# Patient Record
Sex: Male | Born: 1990 | Race: White | Hispanic: No | Marital: Single | Smoking: Current every day smoker
Health system: Southern US, Community
[De-identification: ages and names within clinical notes are randomized; demographics above are authoritative.]

## PROBLEM LIST (undated history)

## (undated) HISTORY — PX: HAND SURGERY: SHX662

---

## 2017-10-26 ENCOUNTER — Emergency Department: Payer: BLUE CROSS/BLUE SHIELD

## 2017-10-26 ENCOUNTER — Other Ambulatory Visit: Payer: Self-pay

## 2017-10-26 ENCOUNTER — Encounter: Payer: Self-pay | Admitting: Emergency Medicine

## 2017-10-26 ENCOUNTER — Emergency Department
Admission: EM | Admit: 2017-10-26 | Discharge: 2017-10-26 | Disposition: A | Discharge status: Home / Self Care | Payer: BLUE CROSS/BLUE SHIELD | Attending: Emergency Medicine | Admitting: Emergency Medicine

## 2017-10-26 DIAGNOSIS — R109 Unspecified abdominal pain: Secondary | ICD-10-CM

## 2017-10-26 DIAGNOSIS — K859 Acute pancreatitis without necrosis or infection, unspecified: Secondary | ICD-10-CM | POA: Diagnosis not present

## 2017-10-26 DIAGNOSIS — F17210 Nicotine dependence, cigarettes, uncomplicated: Secondary | ICD-10-CM | POA: Insufficient documentation

## 2017-10-26 LAB — CBC
HCT: 49.5 % (ref 40.0–52.0)
HEMOGLOBIN: 16.3 g/dL (ref 13.0–18.0)
MCH: 31.7 pg (ref 26.0–34.0)
MCHC: 32.9 g/dL (ref 32.0–36.0)
MCV: 96.4 fL (ref 80.0–100.0)
Platelets: 228 10*3/uL (ref 150–440)
RBC: 5.13 MIL/uL (ref 4.40–5.90)
RDW: 13.1 % (ref 11.5–14.5)
WBC: 11 10*3/uL — ABNORMAL HIGH (ref 3.8–10.6)

## 2017-10-26 LAB — COMPREHENSIVE METABOLIC PANEL
ALT: 26 U/L (ref 17–63)
ANION GAP: 10 (ref 5–15)
AST: 22 U/L (ref 15–41)
Albumin: 4.4 g/dL (ref 3.5–5.0)
Alkaline Phosphatase: 57 U/L (ref 38–126)
BUN: 12 mg/dL (ref 6–20)
CHLORIDE: 104 mmol/L (ref 101–111)
CO2: 25 mmol/L (ref 22–32)
Calcium: 9 mg/dL (ref 8.9–10.3)
Creatinine, Ser: 1.02 mg/dL (ref 0.61–1.24)
GFR calc Af Amer: >60 mL/min (ref >60)
GFR calc non Af Amer: >60 mL/min (ref >60)
Glucose, Bld: 85 mg/dL (ref 65–99)
Potassium: 3.8 mmol/L (ref 3.5–5.1)
SODIUM: 139 mmol/L (ref 135–145)
Total Bilirubin: 0.5 mg/dL (ref 0.3–1.2)
Total Protein: 7.2 g/dL (ref 6.5–8.1)

## 2017-10-26 LAB — URINALYSIS, COMPLETE (UACMP) WITH MICROSCOPIC
Bacteria, UA: NONE SEEN
Bilirubin Urine: NEGATIVE
Glucose, UA: NEGATIVE mg/dL
HGB URINE DIPSTICK: NEGATIVE
KETONES UR: NEGATIVE mg/dL
LEUKOCYTES UA: NEGATIVE
Nitrite: NEGATIVE
PROTEIN: NEGATIVE mg/dL
SQUAMOUS EPITHELIAL / LPF: NONE SEEN
Specific Gravity, Urine: 1.008 (ref 1.005–1.030)
pH: 6 (ref 5.0–8.0)

## 2017-10-26 LAB — LIPASE, BLOOD: LIPASE: 81 U/L — AB (ref 11–51)

## 2017-10-26 MED ORDER — NAPROXEN 375 MG PO TABS: 375.0000 mg | ORAL_TABLET | Freq: Two times a day (BID) | ORAL | 0 refills | Status: AC

## 2017-10-26 MED ORDER — ONDANSETRON HCL 4 MG PO TABS: 4.0000 mg | ORAL_TABLET | Freq: Three times a day (TID) | ORAL | 0 refills | Status: AC | PRN

## 2017-10-26 MED ORDER — KETOROLAC TROMETHAMINE 30 MG/ML IJ SOLN
30.0000 mg | Freq: Once | INTRAMUSCULAR | Status: AC
  Administered 2017-10-26: 30 mg via INTRAVENOUS
  Filled 2017-10-26: qty 1

## 2017-10-26 MED ORDER — IBUPROFEN 400 MG PO TABS: 600.0000 mg | ORAL_TABLET | Freq: Once | ORAL | Status: DC

## 2017-10-26 MED ORDER — IBUPROFEN 400 MG PO TABS
400.0000 mg | ORAL_TABLET | Freq: Once | ORAL | Status: AC
  Administered 2017-10-26: 400 mg via ORAL

## 2017-10-26 MED ORDER — IBUPROFEN 400 MG PO TABS
ORAL_TABLET | ORAL | Status: AC
  Filled 2017-10-26: qty 1

## 2017-10-26 NOTE — ED Notes (Signed)
Pt to the er for RUQ pain that runs diagonal to the bladder. No difficulty in urinating. Pt has a hx of kidney stones. Pt reports urinary frequency. Pt has a hx of kidney stones. Pt says pain feels the same. Pt still has gallbladder.

## 2017-10-26 NOTE — ED Triage Notes (Signed)
Pt c/o lower abdominal pain that started about 45 minutes PTA. Pt states that the pain started after he ate. Pt states that he has a hx/o kidney stones but this pain feels similar. Pt in NAD at this time.

## 2017-10-26 NOTE — Discharge Instructions (Addendum)
Avoid alcohol, return to the emergency room for any new or worrisome symptoms including increased pain, vomiting, fever, follow closely with primary care doctor.  It is certainly possible that you have passed a stone, but we do not see any ongoing kidney stones at this time on CT scan.  Sometimes, we cannot see very small stones so you  may expect to have some pain for the next few days.  In addition, your lipase, which is a pancreas marker, is very slightly elevated.  If you have severe pain in her upper stomach or persistent vomiting high fever or other concerns please return immediately to the emergency department

## 2017-10-26 NOTE — ED Provider Notes (Signed)
[facility] Emergency Department Provider Note  ____________________________________________   I have reviewed the triage vital signs and the nursing notes. Where available I have reviewed prior notes and, if possible and indicated, outside hospital notes.    HISTORY  Chief Complaint Abdominal Pain    HPI Kevin Nash is a 27 y.o. male a history of kidney stones, he does drink alcohol, presents today complaining of right-sided abdominal pain that radiated to his groin started sharp earlier today, no other symptoms.  Feels that he may have passed a kidney stone.  Denies fever chills nausea or vomiting a.  Patient states he does drink alcohol but not to excess.  He does have several beers a day.  He denies any  Other alleviating or aggravating symptoms, pain is mild to moderate at this time.  Feels exactly like prior kidney stone.  History reviewed. No pertinent past medical history.  There are no active problems to display for this patient.   Past Surgical History:  Procedure Laterality Date  . HAND SURGERY Bilateral     Prior to Admission medications   Not on File    Allergies Penicillins  No family history on file.  Social History Social History   Tobacco Use  . Smoking status: Current Every Day Smoker  . Smokeless tobacco: Current User    Types: Snuff  Substance Use Topics  . Alcohol use: Yes  . Drug use: No    Review of Systems Constitutional: No fever/chills Eyes: No visual changes. ENT: No sore throat. No stiff neck no neck pain Cardiovascular: Denies chest pain. Respiratory: Denies shortness of breath. Gastrointestinal:   no vomiting.  No diarrhea.  No constipation. Genitourinary: Negative for dysuria. Musculoskeletal: Negative lower extremity swelling Skin: Negative for rash. Neurological: Negative for severe headaches, focal weakness or numbness.   ____________________________________________   PHYSICAL  EXAM:  VITAL SIGNS: ED Triage Vitals  Enc Vitals Group     BP 10/26/17 1738 135/87     Pulse Rate 10/26/17 1738 81     Resp 10/26/17 1738 16     Temp 10/26/17 1738 97.9 F (36.6 C)     Temp Source 10/26/17 1738 Oral     SpO2 10/26/17 1738 96 %     Weight 10/26/17 1741 165 lb (74.8 kg)     Height 10/26/17 1741 5\' 7"  (1.702 m)     Head Circumference --      Peak Flow --      Pain Score 10/26/17 1738 4     Pain Loc --      Pain Edu? --      Excl. in GC? --     Constitutional: Alert and oriented. Well appearing and in no acute distress. Eyes: Conjunctivae are normal Head: Atraumatic HEENT: No congestion/rhinnorhea. Mucous membranes are moist.  Oropharynx non-erythematous Neck:   Nontender with no meningismus, no masses, no stridor Cardiovascular: Normal rate, regular rhythm. Grossly normal heart sounds.  Good peripheral circulation. Respiratory: Normal respiratory effort.  No retractions. Lungs CTAB. Abdominal: Soft and nontender. No distention. No guarding no rebound Back:  There is no focal tenderness or step off.  there is no midline tenderness there are no lesions noted. there is positive right CVA tenderness Musculoskeletal: No lower extremity tenderness, no upper extremity tenderness. No joint effusions, no DVT signs strong distal pulses no edema Neurologic:  Normal speech and language. No gross focal neurologic deficits are appreciated.  Skin:  Skin is warm, dry and intact. No rash  noted. Psychiatric: Mood and affect are normal. Speech and behavior are normal.  ____________________________________________   LABS (all labs ordered are listed, but only abnormal results are displayed)  Labs Reviewed  LIPASE, BLOOD - Abnormal; Notable for the following components:      Result Value   Lipase 81 (*)    All other components within normal limits  CBC - Abnormal; Notable for the following components:   WBC 11.0 (*)    All other components within normal limits  URINALYSIS,  COMPLETE (UACMP) WITH MICROSCOPIC - Abnormal; Notable for the following components:   Color, Urine STRAW (*)    APPearance CLEAR (*)    All other components within normal limits  COMPREHENSIVE METABOLIC PANEL    Pertinent labs  results that were available during my care of the patient were reviewed by me and considered in my medical decision making (see chart for details). ____________________________________________  EKG  I personally interpreted any EKGs ordered by me or triage  ____________________________________________  RADIOLOGY  Pertinent labs & imaging results that were available during my care of the patient were reviewed by me and considered in my medical decision making (see chart for details). If possible, patient and/or family made aware of any abnormal findings.  Ct Renal Stone Study  Result Date: 10/26/2017 CLINICAL DATA:  Right upper quadrant pain radiating to the pelvis. Urinary frequency. EXAM: CT ABDOMEN AND PELVIS WITHOUT CONTRAST TECHNIQUE: Multidetector CT imaging of the abdomen and pelvis was performed following the standard protocol without IV contrast. COMPARISON:  None. FINDINGS: Lower chest: No acute abnormality. Hepatobiliary: No focal liver abnormality is seen. No gallstones, gallbladder wall thickening, or biliary dilatation. Pancreas: Unremarkable. No pancreatic ductal dilatation or surrounding inflammatory changes. Spleen: Normal in size without focal abnormality. Adrenals/Urinary Tract: Adrenal glands are unremarkable. Kidneys are normal, without renal calculi, focal lesion, or hydronephrosis. Bladder is unremarkable. Stomach/Bowel: Stomach is within normal limits. Appendix appears normal. No evidence of bowel wall thickening, distention, or inflammatory changes. Vascular/Lymphatic: No significant vascular findings are present. No enlarged abdominal or pelvic lymph nodes. Reproductive: Prostate is unremarkable. Other: No abdominal wall hernia or abnormality. No  abdominopelvic ascites. Musculoskeletal: No acute or significant osseous findings. IMPRESSION: No evidence of obstructive uropathy. Normal appendix. No acute abnormalities, accounting for lack of IV and oral contrast. Electronically Signed   By: Ted Mcalpine M.D.   On: 10/26/2017 22:05   ____________________________________________    PROCEDURES  Procedure(s) performed: None  Procedures  Critical Care performed: None  ____________________________________________   INITIAL IMPRESSION / ASSESSMENT AND PLAN / ED COURSE  Pertinent labs & imaging results that were available during my care of the patient were reviewed by me and considered in my medical decision making (see chart for details).  Here with sudden onset right flank pain radiating to the groin, no fevers or chills, blood work reassuring, I did do a CT scan because the patient did not have any blood in his urine and I was concerned that other pathology might be present such as appendicitis although most of his pain is in his right side.  CT is unremarkable.  Patient does have a mildly elevated lipase, CT scan shows no evidence however of significant pancreatitis.  He has no discomfort in that area.  He does admit to drinking some beers earlier today.  I have advised him to decrease his alcohol intake.  At this time, there is no evidence of acute intrathoracic or intra-abdominal pathology accounting for his pain.  Testicular exam is  normal there is no mass having tenderness irregularity or discomfort.  Nor is there any penile lesion noted..    ____________________________________________   FINAL CLINICAL IMPRESSION(S) / ED DIAGNOSES  Final diagnoses:  None      This chart was dictated using voice recognition software.  Despite best efforts to proofread,  errors can occur which can change meaning.      Jeanmarie PlantMcShane, Brodie Correll A, MD 10/26/17 2223

## 2019-07-06 IMAGING — CT CT RENAL STONE PROTOCOL
2 of 4 series · 16 of 46 positions shown, 18 images · non-contrast
Comparison: None.

CLINICAL DATA: Right upper quadrant pain radiating to the pelvis.
Urinary frequency.

EXAM:
CT ABDOMEN AND PELVIS WITHOUT CONTRAST
TECHNIQUE: Multidetector CT imaging of the abdomen and pelvis was performed
following the standard protocol without IV contrast.

[Series 2: stone full standard · axial · 0.72mm/px · z∈[+397,+832]mm · 13 of 95 slices shown, 15 images]
[im 4/95  soft-tissue]
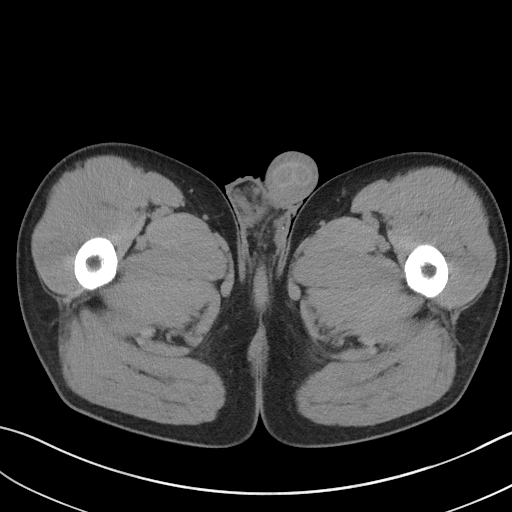
[im 4/95  bone]
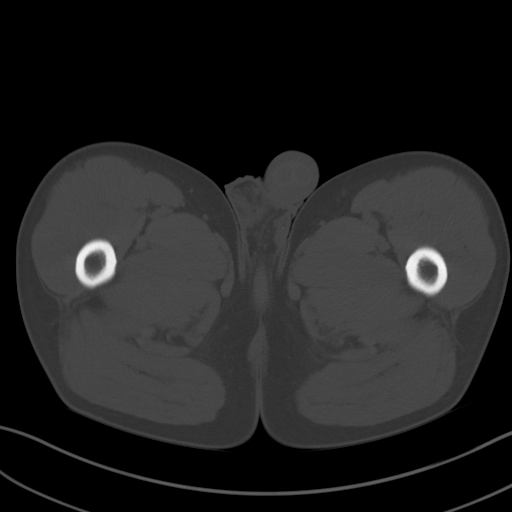
[im 12/95  soft-tissue]
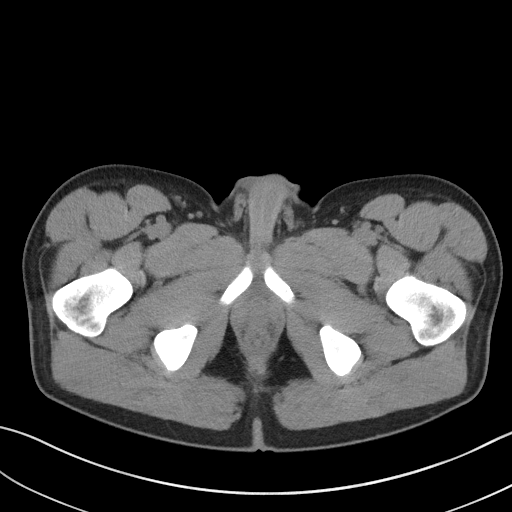
[im 19/95  soft-tissue]
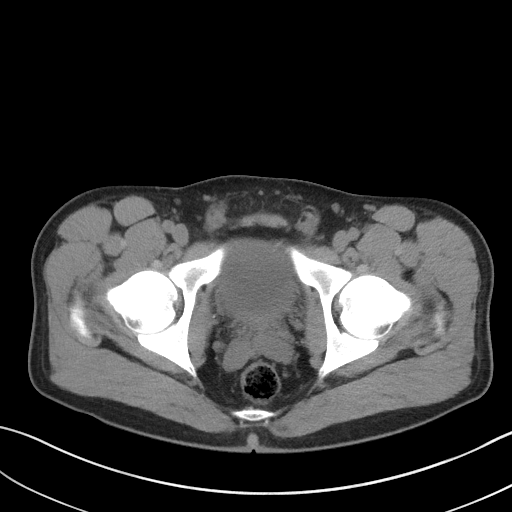
[im 27/95  soft-tissue]
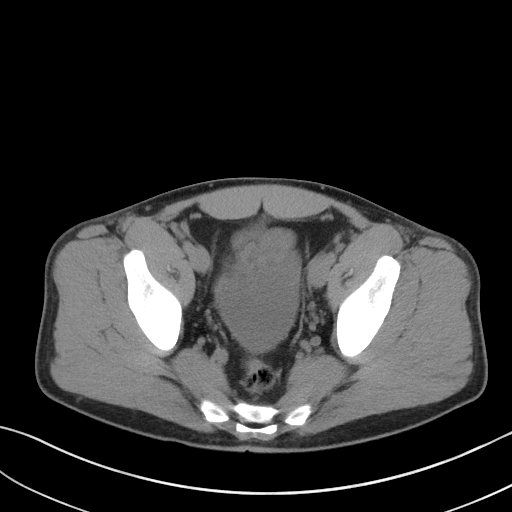
[im 34/95  soft-tissue]
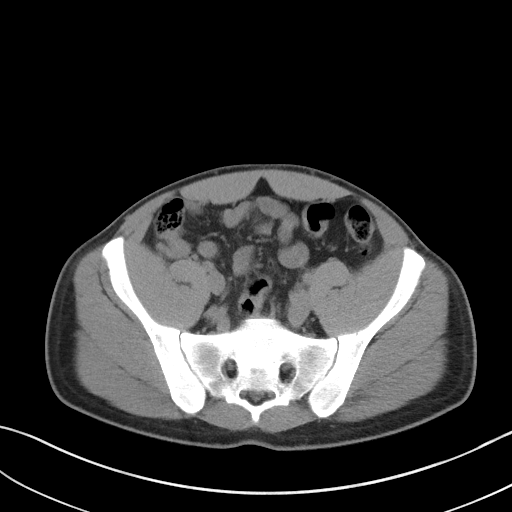
[im 42/95  soft-tissue]
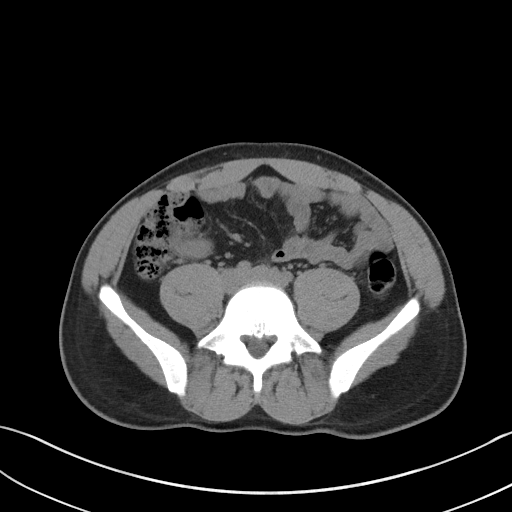
[im 49/95  soft-tissue]
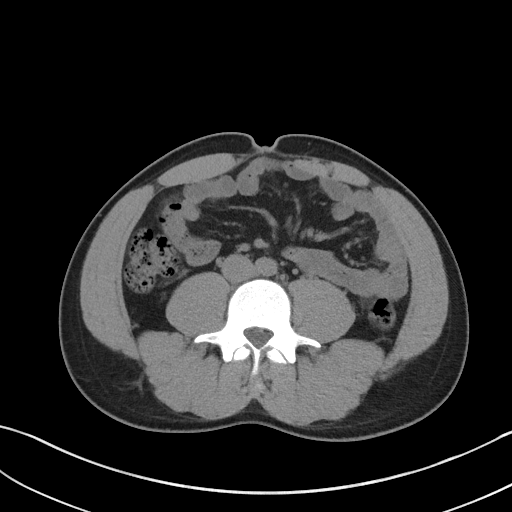
[im 53/95  soft-tissue]
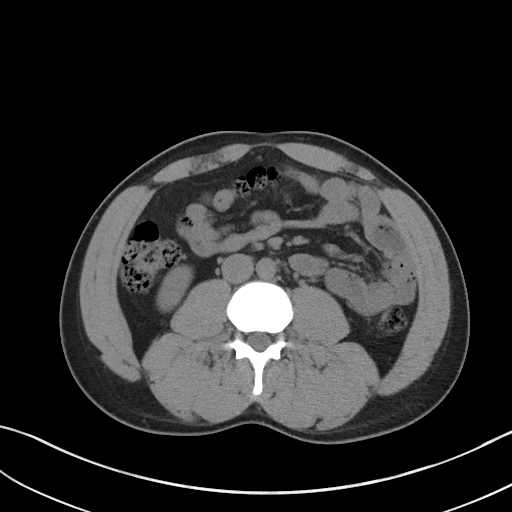
[im 61/95  soft-tissue]
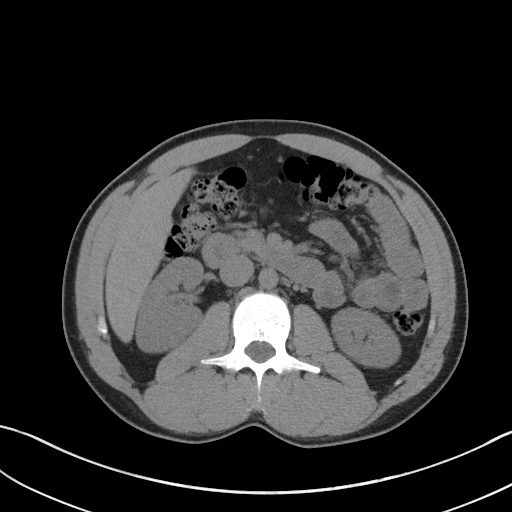
[im 61/95  bone]
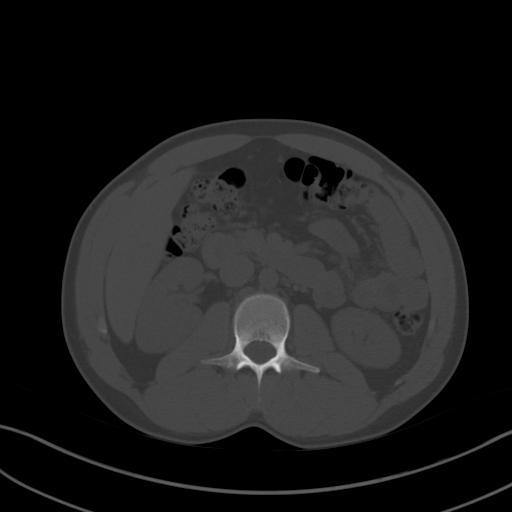
[im 68/95  soft-tissue]
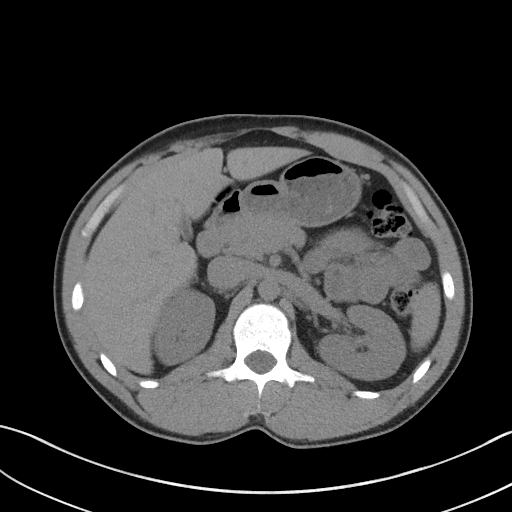
[im 76/95  soft-tissue]
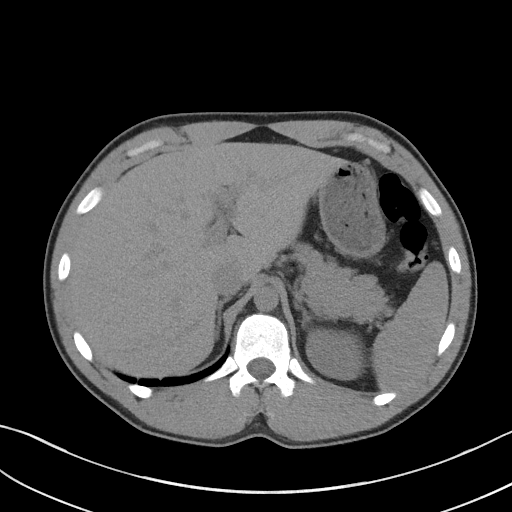
[im 83/95  soft-tissue]
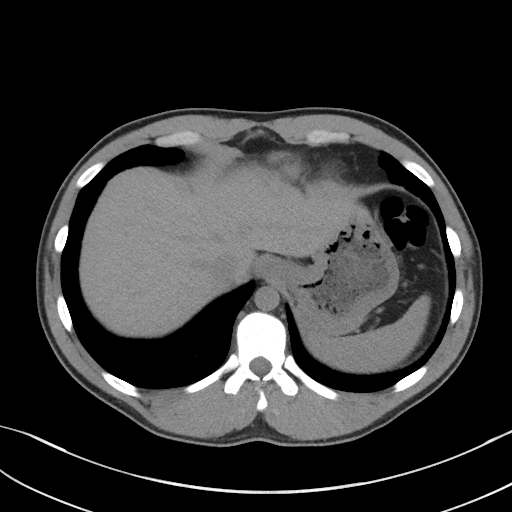
[im 91/95  soft-tissue]
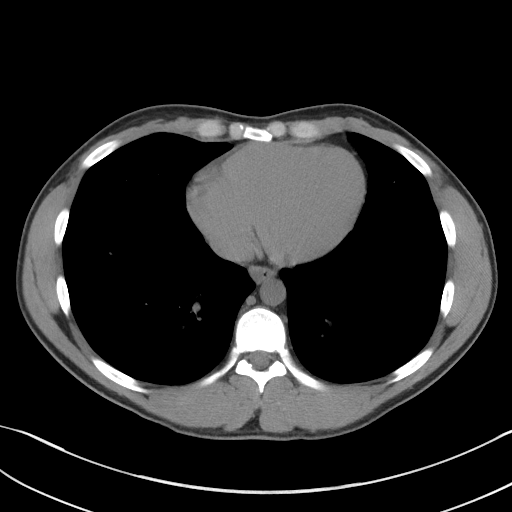

[Series 5: coronal · coronal · 0.66mm/px · 3 of 125 slices shown]
[im 42/125  soft-tissue]
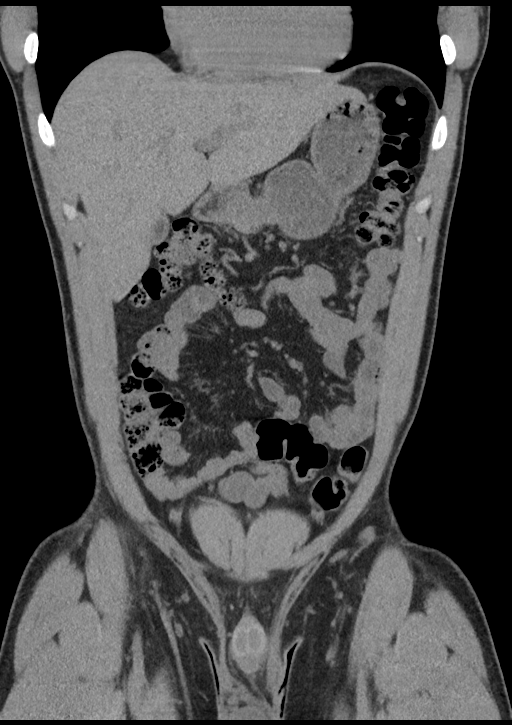
[im 56/125  soft-tissue]
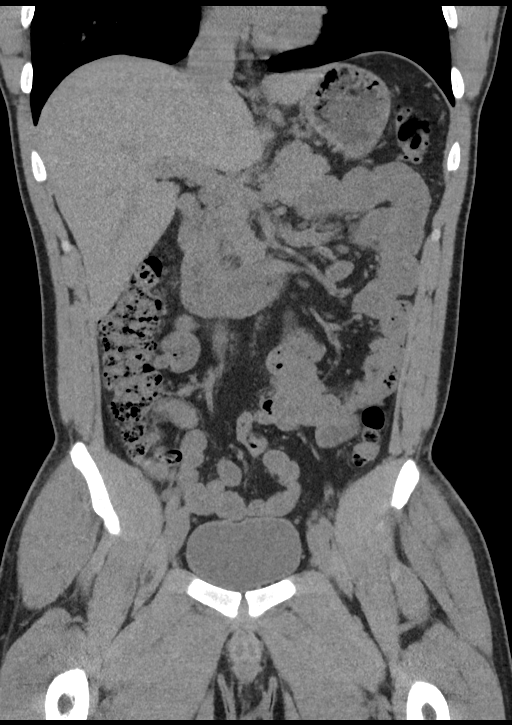
[im 69/125  soft-tissue]
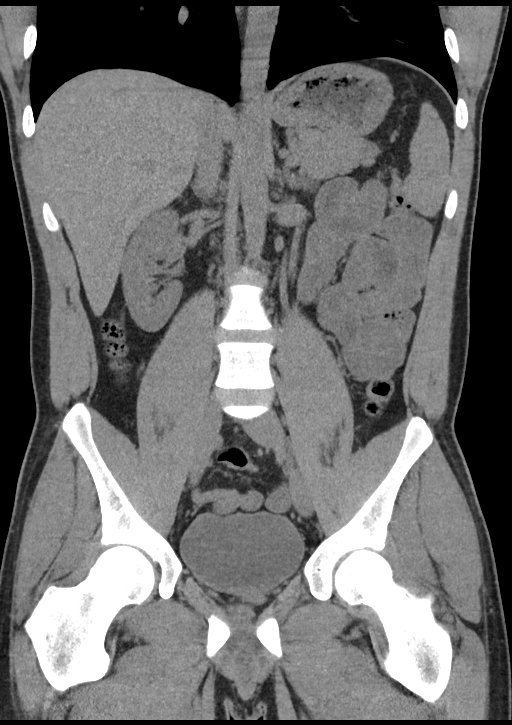

[16 of 46 positions shown; findings below may reference images not displayed]

FINDINGS: Lower chest: No acute abnormality.

Hepatobiliary: No focal liver abnormality is seen. No gallstones,
gallbladder wall thickening, or biliary dilatation.

Pancreas: Unremarkable. No pancreatic ductal dilatation or
surrounding inflammatory changes.

Spleen: Normal in size without focal abnormality.

Adrenals/Urinary Tract: Adrenal glands are unremarkable. Kidneys are
normal, without renal calculi, focal lesion, or hydronephrosis.
Bladder is unremarkable.

Stomach/Bowel: Stomach is within normal limits. Appendix appears
normal. No evidence of bowel wall thickening, distention, or
inflammatory changes.

Vascular/Lymphatic: No significant vascular findings are present. No
enlarged abdominal or pelvic lymph nodes.

Reproductive: Prostate is unremarkable.

Other: No abdominal wall hernia or abnormality. No abdominopelvic
ascites.

Musculoskeletal: No acute or significant osseous findings.
IMPRESSION: No evidence of obstructive uropathy.

Normal appendix.

No acute abnormalities, accounting for lack of IV and oral contrast.

## 2020-10-18 ENCOUNTER — Other Ambulatory Visit: Payer: Self-pay

## 2020-10-18 ENCOUNTER — Ambulatory Visit (HOSPITAL_COMMUNITY)
Admission: EM | Admit: 2020-10-18 | Discharge: 2020-10-18 | Disposition: A | Discharge status: Home / Self Care | Payer: BLUE CROSS/BLUE SHIELD
